# Patient Record
Sex: Female | Born: 1990 | Race: Asian | Hispanic: No | Marital: Single | Smoking: Never smoker
Health system: Southern US, Community
[De-identification: ages and names within clinical notes are randomized; demographics above are authoritative.]

## PROBLEM LIST (undated history)

## (undated) DIAGNOSIS — Z789 Other specified health status: Secondary | ICD-10-CM

---

## 2014-08-28 ENCOUNTER — Ambulatory Visit
Admission: RE | Admit: 2014-08-28 | Discharge: 2014-08-28 | Disposition: A | Discharge status: Home / Self Care | Payer: No Typology Code available for payment source | Source: Ambulatory Visit | Attending: Infectious Disease | Admitting: Infectious Disease

## 2014-08-28 ENCOUNTER — Other Ambulatory Visit: Payer: Self-pay | Admitting: Infectious Disease

## 2014-08-28 DIAGNOSIS — R7611 Nonspecific reaction to tuberculin skin test without active tuberculosis: Secondary | ICD-10-CM

## 2015-02-19 IMAGING — CR DG CHEST 1V
1 series · 1 of 1 positions shown · non-contrast
Comparison: None.

CLINICAL DATA: Positive TB blood test.  Node chest complaints.

EXAM:
CHEST - 1 VIEW

[view not recorded]
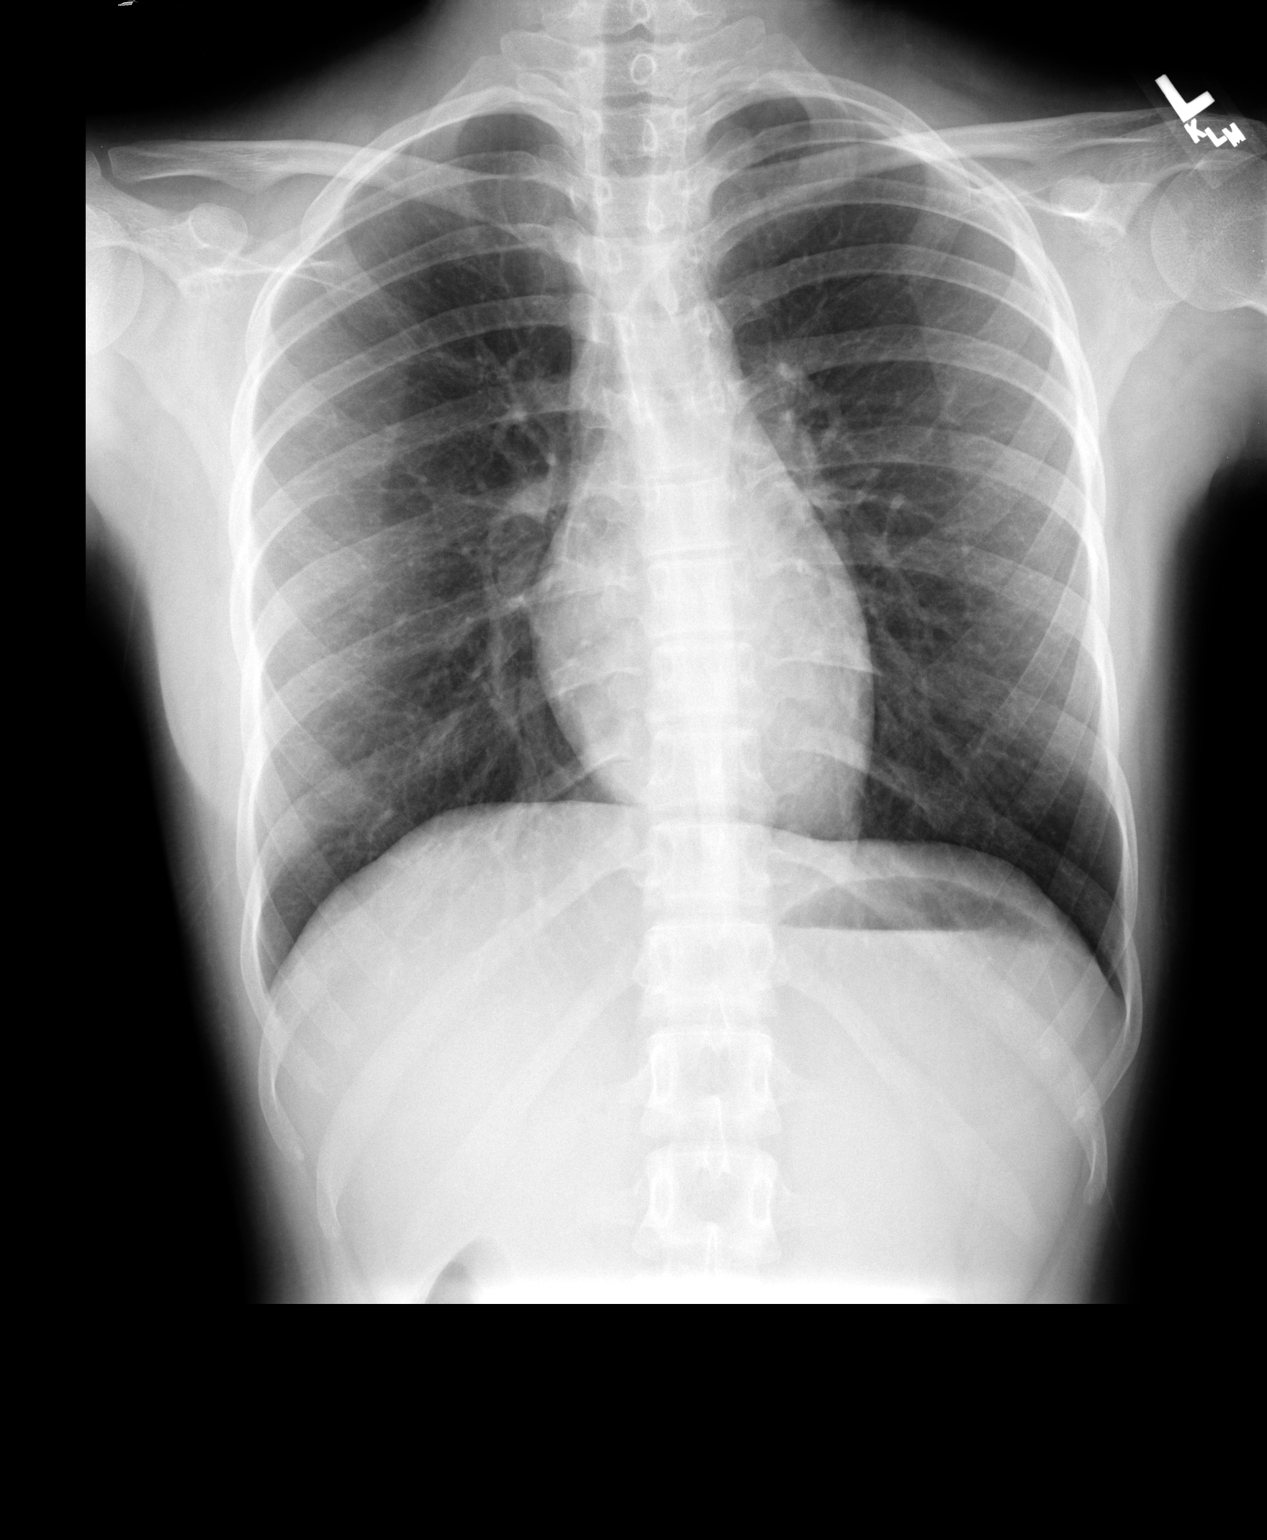

[1 of 1 positions shown; findings below may reference images not displayed]

FINDINGS: The heart size and mediastinal contours are within normal limits.
Both lungs are clear. The visualized skeletal structures are
unremarkable.
IMPRESSION: No active disease.

## 2020-02-26 ENCOUNTER — Ambulatory Visit: Payer: Self-pay

## 2020-11-19 ENCOUNTER — Ambulatory Visit: Payer: Self-pay | Attending: Internal Medicine

## 2020-11-19 DIAGNOSIS — Z23 Encounter for immunization: Secondary | ICD-10-CM

## 2020-11-19 NOTE — Progress Notes (Signed)
   Covid-19 Vaccination Clinic  Name:  Cheyenne Pena    MRN: [id number] DOB: 1991/10/10  11/19/2020  [name] was observed post Covid-19 immunization for 15 minutes without incident. She was provided with Vaccine Information Sheet and instruction to access the V-Safe system.   [name] was instructed to call 911 with any severe reactions post vaccine: Marland Kitchen Difficulty breathing  . Swelling of face and throat  . A fast heartbeat  . A bad rash all over body  . Dizziness and weakness   Immunizations Administered    Name Date Dose VIS Date Route   Pfizer COVID-19 Vaccine 11/19/2020  5:40 PM 0.3 mL 09/01/2020 Intramuscular   Manufacturer: [employer], [employer]   Lot: [id number]   NDC: [address]-5

## 2021-03-14 ENCOUNTER — Ambulatory Visit (INDEPENDENT_AMBULATORY_CARE_PROVIDER_SITE_OTHER): Payer: 59 | Admitting: Podiatry

## 2021-03-14 ENCOUNTER — Other Ambulatory Visit: Payer: Self-pay

## 2021-03-14 DIAGNOSIS — B070 Plantar wart: Secondary | ICD-10-CM | POA: Diagnosis not present

## 2021-03-14 NOTE — Progress Notes (Signed)
   Subjective: 30 y.o. female presenting today as a new patient for evaluation of pain and tenderness to the right plantar forefoot.  Patient states that she does have a history of warts to the left foot.  They are eventually healed with steady application of salicylic acid.  She presents for further treatment evaluation for the right foot   No past medical history on file.  Objective: Physical Exam General: The patient is alert and oriented x3 in no acute distress.   Dermatology: Hyperkeratotic skin lesion(s) noted to the plantar aspect of the right foot approximately 1 cm in diameter. Pinpoint bleeding noted upon debridement. Skin is warm, dry and supple bilateral lower extremities. Negative for open lesions or macerations.   Vascular: Palpable pedal pulses bilaterally. No edema or erythema noted. Capillary refill within normal limits.   Neurological: Epicritic and protective threshold grossly intact bilaterally.    Musculoskeletal Exam: Pain on palpation to the noted skin lesion(s).  Range of motion within normal limits to all pedal and ankle joints bilateral. Muscle strength 5/5 in all groups bilateral.    Assessment: #1 plantar warts right foot; superficial spreading type #2 pain in right foot     Plan of Care:  #1 Patient was evaluated. #2 Excisional debridement of the plantar wart lesion(s) was performed using a chisel blade.  Salicylic acid was applied and the lesion(s) was dressed with a dry sterile dressing. #3 salicylic acid was provided for the patient to apply daily #4 return to clinic in 4 weeks  *From [place]    Neelie Welshans M. Reymond Maynez, DPM [facility]  Dr. Felecia Shelling, DPM    [address] [place], [place] [address]                Office [phone]  Fax [phone]

## 2021-04-18 ENCOUNTER — Ambulatory Visit: Payer: 59 | Admitting: Podiatry

## 2022-11-02 ENCOUNTER — Ambulatory Visit: Payer: Self-pay | Admitting: Internal Medicine

## 2024-02-18 LAB — OB RESULTS CONSOLE RPR: RPR: NONREACTIVE

## 2024-02-18 LAB — OB RESULTS CONSOLE RUBELLA ANTIBODY, IGM: Rubella: IMMUNE

## 2024-02-18 LAB — OB RESULTS CONSOLE HIV ANTIBODY (ROUTINE TESTING): HIV: NONREACTIVE

## 2024-02-18 LAB — OB RESULTS CONSOLE HEPATITIS B SURFACE ANTIGEN: Hepatitis B Surface Ag: NEGATIVE

## 2024-02-18 LAB — HEPATITIS C ANTIBODY: HCV Ab: NEGATIVE

## 2024-02-18 LAB — OB RESULTS CONSOLE ANTIBODY SCREEN: Antibody Screen: NEGATIVE

## 2024-02-28 LAB — OB RESULTS CONSOLE GC/CHLAMYDIA
Chlamydia: NEGATIVE
Neisseria Gonorrhea: NEGATIVE

## 2024-08-28 LAB — OB RESULTS CONSOLE GBS: GBS: NEGATIVE

## 2024-09-23 ENCOUNTER — Telehealth (HOSPITAL_COMMUNITY): Payer: Self-pay | Admitting: *Deleted

## 2024-09-23 ENCOUNTER — Encounter (HOSPITAL_COMMUNITY): Payer: Self-pay | Admitting: *Deleted

## 2024-09-23 NOTE — Telephone Encounter (Signed)
 Preadmission screen

## 2024-09-24 ENCOUNTER — Telehealth (HOSPITAL_COMMUNITY): Payer: Self-pay | Admitting: *Deleted

## 2024-09-24 ENCOUNTER — Encounter (HOSPITAL_COMMUNITY): Payer: Self-pay | Admitting: *Deleted

## 2024-09-24 NOTE — Telephone Encounter (Signed)
 Preadmission screen

## 2024-09-25 ENCOUNTER — Encounter (HOSPITAL_COMMUNITY): Payer: Self-pay

## 2024-09-25 ENCOUNTER — Other Ambulatory Visit: Payer: Self-pay

## 2024-09-25 ENCOUNTER — Inpatient Hospital Stay (HOSPITAL_COMMUNITY): Admitting: Anesthesiology

## 2024-09-25 ENCOUNTER — Encounter (HOSPITAL_COMMUNITY): Payer: Self-pay | Admitting: Obstetrics and Gynecology

## 2024-09-25 ENCOUNTER — Inpatient Hospital Stay (HOSPITAL_COMMUNITY)
Admission: AD | Admit: 2024-09-25 | Discharge: 2024-09-27 | DRG: 807 | Disposition: A | Discharge status: Home / Self Care | Attending: Obstetrics and Gynecology | Admitting: Obstetrics and Gynecology

## 2024-09-25 DIAGNOSIS — Z3A40 40 weeks gestation of pregnancy: Secondary | ICD-10-CM | POA: Diagnosis not present

## 2024-09-25 DIAGNOSIS — Z833 Family history of diabetes mellitus: Secondary | ICD-10-CM | POA: Diagnosis not present

## 2024-09-25 DIAGNOSIS — Z8249 Family history of ischemic heart disease and other diseases of the circulatory system: Secondary | ICD-10-CM | POA: Diagnosis not present

## 2024-09-25 DIAGNOSIS — Z3490 Encounter for supervision of normal pregnancy, unspecified, unspecified trimester: Principal | ICD-10-CM

## 2024-09-25 DIAGNOSIS — O26893 Other specified pregnancy related conditions, third trimester: Secondary | ICD-10-CM | POA: Diagnosis present

## 2024-09-25 HISTORY — DX: Other specified health status: Z78.9

## 2024-09-25 LAB — TYPE AND SCREEN
ABO/RH(D): O POS
Antibody Screen: NEGATIVE

## 2024-09-25 LAB — CBC
HCT: 43.9 % (ref 36.0–46.0)
Hemoglobin: 15 g/dL (ref 12.0–15.0)
MCH: 29.5 pg (ref 26.0–34.0)
MCHC: 34.2 g/dL (ref 30.0–36.0)
MCV: 86.2 fL (ref 80.0–100.0)
Platelets: 253 K/uL (ref 150–400)
RBC: 5.09 MIL/uL (ref 3.87–5.11)
RDW: 13.6 % (ref 11.5–15.5)
WBC: 18.6 K/uL — ABNORMAL HIGH (ref 4.0–10.5)
nRBC: 0 % (ref 0.0–0.2)

## 2024-09-25 LAB — HIV ANTIBODY (ROUTINE TESTING W REFLEX): HIV Screen 4th Generation wRfx: NONREACTIVE

## 2024-09-25 MED ORDER — EPHEDRINE 5 MG/ML INJ: 10.0000 mg | INTRAVENOUS | Status: DC | PRN

## 2024-09-25 MED ORDER — PHENYLEPHRINE 80 MCG/ML (10ML) SYRINGE FOR IV PUSH (FOR BLOOD PRESSURE SUPPORT): 80.0000 ug | PREFILLED_SYRINGE | INTRAVENOUS | Status: DC | PRN

## 2024-09-25 MED ORDER — LIDOCAINE HCL (PF) 1 % IJ SOLN: 30.0000 mL | INTRAMUSCULAR | Status: DC | PRN

## 2024-09-25 MED ORDER — FENTANYL-BUPIVACAINE-NACL 0.5-0.125-0.9 MG/250ML-% EP SOLN
12.0000 mL/h | EPIDURAL | Status: DC | PRN
  Administered 2024-09-25: 12 mL/h via EPIDURAL
  Filled 2024-09-25: qty 250

## 2024-09-25 MED ORDER — DIPHENHYDRAMINE HCL 50 MG/ML IJ SOLN: 12.5000 mg | INTRAMUSCULAR | Status: DC | PRN

## 2024-09-25 MED ORDER — SOD CITRATE-CITRIC ACID 500-334 MG/5ML PO SOLN
30.0000 mL | ORAL | Status: DC | PRN
Start: 2024-09-25 — End: 2024-09-26

## 2024-09-25 MED ORDER — LIDOCAINE HCL (PF) 1 % IJ SOLN
INTRAMUSCULAR | Status: DC | PRN
  Administered 2024-09-25 (×2): 5 mL via EPIDURAL

## 2024-09-25 MED ORDER — ACETAMINOPHEN 325 MG PO TABS: 650.0000 mg | ORAL_TABLET | ORAL | Status: DC | PRN

## 2024-09-25 MED ORDER — FENTANYL CITRATE (PF) 100 MCG/2ML IJ SOLN: 50.0000 ug | INTRAMUSCULAR | Status: DC | PRN

## 2024-09-25 MED ORDER — OXYCODONE-ACETAMINOPHEN 5-325 MG PO TABS: 2.0000 | ORAL_TABLET | ORAL | Status: DC | PRN

## 2024-09-25 MED ORDER — OXYCODONE-ACETAMINOPHEN 5-325 MG PO TABS: 1.0000 | ORAL_TABLET | ORAL | Status: DC | PRN

## 2024-09-25 MED ORDER — LACTATED RINGERS IV SOLN: INTRAVENOUS | Status: DC

## 2024-09-25 MED ORDER — LACTATED RINGERS IV SOLN: 500.0000 mL | INTRAVENOUS | Status: DC | PRN

## 2024-09-25 MED ORDER — LACTATED RINGERS IV SOLN
500.0000 mL | Freq: Once | INTRAVENOUS | Status: AC
  Administered 2024-09-25: 500 mL via INTRAVENOUS

## 2024-09-25 MED ORDER — FLEET ENEMA RE ENEM
1.0000 | ENEMA | RECTAL | Status: DC | PRN
Start: 2024-09-25 — End: 2024-09-26

## 2024-09-25 MED ORDER — OXYTOCIN-SODIUM CHLORIDE 30-0.9 UT/500ML-% IV SOLN
2.5000 [IU]/h | INTRAVENOUS | Status: DC
  Administered 2024-09-25: 2.5 [IU]/h via INTRAVENOUS
  Filled 2024-09-25: qty 500

## 2024-09-25 MED ORDER — OXYTOCIN BOLUS FROM INFUSION
333.0000 mL | Freq: Once | INTRAVENOUS | Status: AC
  Administered 2024-09-25: 333 mL via INTRAVENOUS

## 2024-09-25 MED ORDER — ONDANSETRON HCL 4 MG/2ML IJ SOLN: 4.0000 mg | Freq: Four times a day (QID) | INTRAMUSCULAR | Status: DC | PRN

## 2024-09-25 NOTE — Anesthesia Postprocedure Evaluation (Signed)
 Anesthesia Post Note  Patient: Cheyenne Pena  Procedure(s) Performed: AN AD HOC LABOR EPIDURAL     Patient location during evaluation: Mother Baby Anesthesia Type: Epidural Level of consciousness: awake and alert Pain management: pain level controlled Vital Signs Assessment: post-procedure vital signs reviewed and stable Respiratory status: spontaneous breathing, nonlabored ventilation and respiratory function stable Cardiovascular status: stable Postop Assessment: no headache, no backache and epidural receding Anesthetic complications: no   No notable events documented.  Last Vitals:  Vitals:   09/25/24 2003 09/25/24 2356  BP: (!) 97/54   Pulse: 68   Resp: 18   Temp:  36.8 C  SpO2:      Last Pain:  Vitals:   09/25/24 2356  TempSrc: Oral  PainSc:    Pain Goal:    LLE Motor Response: Purposeful movement (09/25/24 2319) LLE Sensation: Tingling (09/25/24 2319) RLE Motor Response: Purposeful movement (09/25/24 2319) RLE Sensation: Tingling (09/25/24 2319)     Epidural/Spinal Function Cutaneous sensation: Tingles (09/25/24 2350), Patient able to flex knees: Yes (09/25/24 2319), Patient able to lift hips off bed: Yes (09/25/24 2319), Back pain beyond tenderness at insertion site: No (09/25/24 2319), Progressively worsening motor and/or sensory loss: No (09/25/24 2319), Bowel and/or bladder incontinence post epidural: No (09/25/24 2319)  Naturi Alarid

## 2024-09-25 NOTE — MAU Provider Note (Signed)
 S: Ms. Cheyenne Pena is a 33 y.o. G1P0 at [redacted]w[redacted]d  who presents to MAU today for labor evaluation.   She was evaluated, by the nurse, and has made little to no cervical change since arrival.  The nurse requests provider review strip and discharge as appropriate.   Cervical exam by RN:  Dilation: 1.5 Effacement (%): 80 Cervical Position: Middle Station: -3 Presentation: Vertex Exam by:: amber richard rnc  Follow up SCE: Dilation: 4 Effacement (%): 90 Cervical Position: Middle Station: -3 Presentation: Vertex Exam by:: amber richard rnc  Fetal Monitoring: Baseline: 125 Variability: moderate Accelerations: 15x15 Decelerations: absent Contractions: 2-5  MDM Patient status discussed with RN.  EFM Reviewed  A: 33 y.o. G1P0 [redacted]w[redacted]d  Uterine contractions   P: Admit to L&D under private provider. Care turned over to Dr. Tomblin   [name] MSN, CNM  09/25/2024 4:28 PM

## 2024-09-25 NOTE — Anesthesia Preprocedure Evaluation (Addendum)
 Anesthesia Evaluation  Patient identified by MRN, date of birth, ID band Patient awake    Reviewed: Allergy & Precautions, H&P , NPO status , Patient's Chart, lab work & pertinent test results, reviewed documented beta blocker date and time   Airway Mallampati: II  TM Distance: >3 FB Neck ROM: full    Dental no notable dental hx.    Pulmonary neg pulmonary ROS   Pulmonary exam normal breath sounds clear to auscultation       Cardiovascular negative cardio ROS Normal cardiovascular exam Rhythm:regular Rate:Normal     Neuro/Psych negative neurological ROS  negative psych ROS   GI/Hepatic negative GI ROS, Neg liver ROS,,,  Endo/Other  negative endocrine ROS    Renal/GU negative Renal ROS  negative genitourinary   Musculoskeletal negative musculoskeletal ROS (+)    Abdominal   Peds  Hematology negative hematology ROS (+)   Anesthesia Other Findings   Reproductive/Obstetrics (+) Pregnancy                              Anesthesia Physical Anesthesia Plan  ASA: 2  Anesthesia Plan: Epidural   Post-op Pain Management:    Induction:   PONV Risk Score and Plan:   Airway Management Planned:   Additional Equipment: None  Intra-op Plan:   Post-operative Plan:   Informed Consent: I have reviewed the patients History and Physical, chart, labs and discussed the procedure including the risks, benefits and alternatives for the proposed anesthesia with the patient or authorized representative who has indicated his/her understanding and acceptance.       Plan Discussed with: Anesthesiologist  Anesthesia Plan Comments:          Anesthesia Quick Evaluation

## 2024-09-25 NOTE — Progress Notes (Signed)
 Comfortable with epidural in place FHT cat one Cx 5/C/-2/vtx AROM>thin mec

## 2024-09-25 NOTE — Anesthesia Procedure Notes (Signed)
 Epidural Patient location during procedure: OB Start time: 09/25/2024 5:30 PM End time: 09/25/2024 5:39 PM  Staffing Anesthesiologist: Jerrye Sharper, MD Performed: anesthesiologist   Preanesthetic Checklist Completed: patient identified, IV checked, site marked, risks and benefits discussed, surgical consent, monitors and equipment checked, pre-op evaluation and timeout performed  Epidural Patient position: sitting Prep: DuraPrep and site prepped and draped Patient monitoring: continuous pulse ox and blood pressure Approach: midline Location: L4-L5 Injection technique: LOR air  Needle:  Needle type: Tuohy  Needle gauge: 17 G Needle length: 9 cm and 9 Needle insertion depth: 5 cm Catheter type: closed end flexible Catheter size: 19 Gauge Catheter at skin depth: 10 cm Test dose: negative and Other  Assessment Events: blood not aspirated, no cerebrospinal fluid, injection not painful, no injection resistance, no paresthesia and negative IV test  Additional Notes Patient identified. Risks and benefits discussed including failed block, incomplete  Pain control, post dural puncture headache, nerve damage, paralysis, blood pressure Changes, nausea, vomiting, reactions to medications-both toxic and allergic and post Partum back pain. All questions were answered. Patient expressed understanding and wished to proceed. Sterile technique was used throughout procedure. Epidural site was Dressed with sterile barrier dressing. No paresthesias, signs of intravascular injection Or signs of intrathecal spread were encountered.  Patient was more comfortable after the epidural was dosed. Please see RN's note for documentation of vital signs and FHR which are stable. Reason for block:procedure for pain

## 2024-09-25 NOTE — MAU Note (Signed)
 Cheyenne Pena is a 33 y.o. at [redacted]w[redacted]d here in MAU reporting: contractions since early this morning- She reports +FMs, Denies LOF, VB,  blurry vision, headaches, peripheral edema, or RUQ pain.   LMP: na Onset of complaint: early am Pain score: 5/10 Vitals:   09/25/24 1314  BP: 117/74  Pulse: 98  Resp: 16  SpO2: 100%     FHT: in room  Lab orders placed from triage: mau labor

## 2024-09-25 NOTE — Progress Notes (Signed)
 Delivery Note At 10:55 PM a viable female was delivered via Vaginal, Spontaneous (Presentation: Right Occiput Anterior).  APGAR: 9, 9; weight  .   Placenta status: Spontaneous;Pathology, Intact.  Cord: 3 vessels with the following complications: nuchal x 1.   Anesthesia: Epidural Episiotomy: ML second degree repaired Lacerations: Suture Repair: 2.0 vicryl rapide Est. Blood Loss (mL): 103  Mom to postpartum.  Baby to Couplet care / Skin to Skin.  Marlon Suleiman E Gareth Fitzner II 09/25/2024, 11:17 PM

## 2024-09-25 NOTE — H&P (Signed)
 Cheyenne Pena is a 33 y.o. female presenting for UCs. PNC uncomplicated. OB History     Gravida  1   Para      Term      Preterm      AB      Living         SAB      IAB      Ectopic      Multiple      Live Births             History reviewed. No pertinent past medical history. History reviewed. No pertinent surgical history. Family History: family history includes Cancer - Lung in her paternal grandfather; Diabetes in her maternal grandfather and paternal grandmother; Heart attack in her paternal grandmother; Hypertension in her maternal grandfather and paternal grandmother; Thyroid disease in her father. Social History:  reports that she has never smoked. She has never used smokeless tobacco. She reports that she does not currently use alcohol. She reports that she does not use drugs.     Maternal Diabetes: No Genetic Screening: Normal Maternal Ultrasounds/Referrals: Normal Fetal Ultrasounds or other Referrals:  None Maternal Substance Abuse:  No Significant Maternal Medications:  None Significant Maternal Lab Results:  Group B Strep negative Number of Prenatal Visits:greater than 3 verified prenatal visits Maternal Vaccinations: Other Comments:  None  Review of Systems  Constitutional:  Negative for fever.   Maternal Medical History:  Reason for admission: Contractions.   Fetal activity: Perceived fetal activity is normal.     Dilation: 4 Effacement (%): 90 Station: -3 Exam by:: amber richard rnc Blood pressure 110/80, pulse 81, resp. rate 16, SpO2 100%.   Fetal Exam Fetal State Assessment: Category I - tracings are normal.   Physical Exam Cardiovascular:     Rate and Rhythm: Normal rate.  Pulmonary:     Effort: Pulmonary effort is normal.     Prenatal labs: ABO, Rh:   Antibody: Negative (04/07 0000) Rubella: Immune (04/07 0000) RPR: Nonreactive (04/07 0000)  HBsAg: Negative (04/07 0000)  HIV: Non-reactive (04/07 0000)  GBS:  Negative/-- (10/16 0000)   Assessment/Plan: 33 yo G1P0 @ 40 0/7 wks Labor Admit to L&D   Lynwood FORBES Clubs II 09/25/2024, 3:57 PM

## 2024-09-26 ENCOUNTER — Encounter (HOSPITAL_COMMUNITY): Payer: Self-pay | Admitting: Obstetrics and Gynecology

## 2024-09-26 LAB — CBC
HCT: 30.8 % — ABNORMAL LOW (ref 36.0–46.0)
Hemoglobin: 10.7 g/dL — ABNORMAL LOW (ref 12.0–15.0)
MCH: 29.6 pg (ref 26.0–34.0)
MCHC: 34.7 g/dL (ref 30.0–36.0)
MCV: 85.3 fL (ref 80.0–100.0)
Platelets: 202 K/uL (ref 150–400)
RBC: 3.61 MIL/uL — ABNORMAL LOW (ref 3.87–5.11)
RDW: 13.5 % (ref 11.5–15.5)
WBC: 16.7 K/uL — ABNORMAL HIGH (ref 4.0–10.5)
nRBC: 0 % (ref 0.0–0.2)

## 2024-09-26 LAB — RPR: RPR Ser Ql: NONREACTIVE

## 2024-09-26 MED ORDER — BENZOCAINE-MENTHOL 20-0.5 % EX AERO
1.0000 | INHALATION_SPRAY | CUTANEOUS | Status: DC | PRN
  Administered 2024-09-27: 1 via TOPICAL
  Filled 2024-09-26 (×2): qty 56

## 2024-09-26 MED ORDER — COCONUT OIL OIL
1.0000 | TOPICAL_OIL | Status: DC | PRN
  Administered 2024-09-26: 1 via TOPICAL

## 2024-09-26 MED ORDER — ACETAMINOPHEN 325 MG PO TABS
650.0000 mg | ORAL_TABLET | ORAL | Status: DC | PRN
  Administered 2024-09-26: 650 mg via ORAL
  Filled 2024-09-26: qty 2

## 2024-09-26 MED ORDER — PRENATAL MULTIVITAMIN CH
1.0000 | ORAL_TABLET | Freq: Every day | ORAL | Status: DC
  Administered 2024-09-26 – 2024-09-27 (×2): 1 via ORAL
  Filled 2024-09-26 (×2): qty 1

## 2024-09-26 MED ORDER — SIMETHICONE 80 MG PO CHEW: 80.0000 mg | CHEWABLE_TABLET | ORAL | Status: DC | PRN

## 2024-09-26 MED ORDER — TETANUS-DIPHTH-ACELL PERTUSSIS 5-2-15.5 LF-MCG/0.5 IM SUSP: 0.5000 mL | Freq: Once | INTRAMUSCULAR | Status: DC

## 2024-09-26 MED ORDER — IBUPROFEN 600 MG PO TABS
600.0000 mg | ORAL_TABLET | Freq: Four times a day (QID) | ORAL | Status: DC
  Administered 2024-09-26 – 2024-09-27 (×6): 600 mg via ORAL
  Filled 2024-09-26 (×6): qty 1

## 2024-09-26 MED ORDER — ZOLPIDEM TARTRATE 5 MG PO TABS: 5.0000 mg | ORAL_TABLET | Freq: Every evening | ORAL | Status: DC | PRN

## 2024-09-26 MED ORDER — WITCH HAZEL-GLYCERIN EX PADS
1.0000 | MEDICATED_PAD | CUTANEOUS | Status: DC | PRN
Start: 2024-09-26 — End: 2024-09-27

## 2024-09-26 MED ORDER — ONDANSETRON HCL 4 MG PO TABS
4.0000 mg | ORAL_TABLET | ORAL | Status: DC | PRN
Start: 2024-09-26 — End: 2024-09-27

## 2024-09-26 MED ORDER — ONDANSETRON HCL 4 MG/2ML IJ SOLN: 4.0000 mg | INTRAMUSCULAR | Status: DC | PRN

## 2024-09-26 MED ORDER — SENNOSIDES-DOCUSATE SODIUM 8.6-50 MG PO TABS
2.0000 | ORAL_TABLET | ORAL | Status: DC
  Administered 2024-09-26 – 2024-09-27 (×2): 2 via ORAL
  Filled 2024-09-26 (×2): qty 2

## 2024-09-26 MED ORDER — DIPHENHYDRAMINE HCL 25 MG PO CAPS: 25.0000 mg | ORAL_CAPSULE | Freq: Four times a day (QID) | ORAL | Status: DC | PRN

## 2024-09-26 MED ORDER — DIBUCAINE (PERIANAL) 1 % EX OINT: 1.0000 | TOPICAL_OINTMENT | CUTANEOUS | Status: DC | PRN

## 2024-09-26 NOTE — Lactation Note (Signed)
 This note was copied from a baby's chart. Lactation Consultation Note  Patient Name: Cheyenne Pena Unijb'd Date: 09/26/2024 Age:31 hours Reason for consult: Initial assessment;Primapara;Term  P1. Mom unable to latch baby to breast. Baby cueing and hungry. LC attempted to get baby to latch but unable to obtain deep latch. #16 NS applied baby fighting NS.mom concerned because baby hadn't had any food.  Mom shown how to use DEBP & how to disassemble, clean, & reassemble parts. Pre-pump prior to feeding. Mom pumped got glistening on flange #18, it's large for mom. Informed mom on how to buy them. Mom had seen them before. Mom needs like a 13, 0r 15.  Mom stated she wanted formula. LC gave baby some formula. LC will go back for next feeding to try to get baby to latch to the NS at the breast. Newborn feeding habits, STS, I&O reviewed. Mom encouraged to feed baby 8-12 times/24 hours and with feeding cues.  Maternal Data Has patient been taught Hand Expression?: Yes Does the patient have breastfeeding experience prior to this delivery?: No  Feeding Mother's Current Feeding Choice: Breast Milk and Formula Nipple Type: Nfant Slow Flow (purple)  LATCH Score Latch: Too sleepy or reluctant, no latch achieved, no sucking elicited.  Audible Swallowing: None  Type of Nipple: Flat  Comfort (Breast/Nipple): Soft / non-tender  Hold (Positioning): Full assist, staff holds infant at breast  LATCH Score: 3   Lactation Tools Discussed/Used Tools: Shells;Pump;Flanges;Nipple Shields Nipple shield size: 16 Flange Size: 18 Breast pump type: Double-Electric Breast Pump Pump Education: Setup, frequency, and cleaning;Milk Storage Reason for Pumping: NS/flat Pumping frequency: q 3hr Pumped volume: 0 mL  Interventions Interventions: Breast feeding basics reviewed;Assisted with latch;Skin to skin;Breast massage;Hand express;Pre-pump if needed;Breast compression;Adjust position;Support  pillows;Position options;Shells;DEBP;Education;Pace feeding;LC Services brochure  Discharge Discharge Education: Outpatient recommendation Pump: DEBP (spectra )  Consult Status Consult Status: Follow-up Date: 09/26/24 Follow-up type: In-patient    Kattie Santoyo G 09/26/2024, 3:12 AM

## 2024-09-26 NOTE — Progress Notes (Signed)
 Post Partum Day 1 Subjective: no complaints, up ad lib, voiding, and tolerating PO  Objective: Blood pressure (!) 95/59, pulse 81, temperature 97.8 F (36.6 C), temperature source Oral, resp. rate 16, height 5' 4.5 (1.638 m), weight 68 kg, SpO2 100%, unknown if currently breastfeeding.  Physical Exam:  General: alert, cooperative, appears stated age, and no distress Lochia: appropriate Uterine Fundus: firm Incision:   DVT Evaluation: No evidence of DVT seen on physical exam.  Recent Labs    09/25/24 1617 09/26/24 0447  HGB 15.0 10.7*  HCT 43.9 30.8*    Assessment/Plan: Plan for discharge tomorrow and Breastfeeding Declines circumcision for their son  LOS: 1 day   Alm JAYSON Cook, MD 09/26/2024, 8:37 AM

## 2024-09-26 NOTE — Lactation Note (Signed)
 This note was copied from a baby's chart. Lactation Consultation Note  Patient Name: Cheyenne Pena Unijb'd Date: 09/26/2024 Age:31 hours Reason for consult: Follow-up assessment;Difficult latch;Primapara;1st time breastfeeding;Term, MBU nurse asked to see this mom due feeding difficulties.  Baby wide awake when LC entered the room and LC offered to assist to latch and mom receptive. Prior to [facility] entering the room per dad was trying to feed the baby from the bottle.  Per mom the Nipple Shield was started during the night, but isn't working well.  LC assessed breast tissue and noted both nipples be flat and the areola to have edema.  LC reviewed the use of the hand pump and pre - pumped and then applied the #16 NS and instill formula in the top. Baby latched for a few strong sucks and swallows from the formula in the NS . Baby was unable to sustained a latch and noted to protrude the tongue interfering. With sustaining the latch. LC suspects baby had been playing with his tongue prior to birth.  LC plan:  Breast shells while awake between feeding, except when sleeping with coconut oil to softened the areola and stretch it.  Feed the baby with cues and by 3 hours STS.  Use the #16 NS as shown and instill EBM or formula in the top as shown and latch the baby in the football position as shown, after baby feeds supplement with EBM or formula working up towards 30 ml per feeding and post pump both breast for 15 mins and save milk for the next feeding. Next feeding switch to the other breast and do the same.       Maternal Data Has patient been taught Hand Expression?: Yes Does the patient have breastfeeding experience prior to this delivery?: No  Feeding Mother's Current Feeding Choice: Breast Milk and Formula Nipple Type: Extra Slow Flow  LATCH Score Latch: Repeated attempts needed to sustain latch, nipple held in mouth throughout feeding, stimulation needed to elicit sucking  reflex.  Audible Swallowing: A few with stimulation (with formula in the Nipple Shield)  Type of Nipple: Flat (areola none compressible , Nurse to provide coconut oil)  Comfort (Breast/Nipple): Soft / non-tender  Hold (Positioning): Full assist, staff holds infant at breast  LATCH Score: 5   Lactation Tools Discussed/Used Tools: Shells;Pump;Flanges;Nipple Shields Nipple shield size: 16 Flange Size: 18;21;Other (comment) (when LC reviewed the hand pump for prepump the #18 F was not confortable for the mom and the #21 F was more comfortable. probably due to the areola edema that needs work and time to soften.) Breast pump type: Manual;Double-Electric Breast Pump Pump Education: Milk Storage;Setup, frequency, and cleaning Reason for Pumping: DL . Flay and areola edema,  Interventions Interventions: Breast feeding basics reviewed;Assisted with latch;Skin to skin;Breast massage;Hand express;Pre-pump if needed;Reverse pressure;Breast compression;Adjust position;Position options;Support pillows;Shells;Hand pump;Education;LC Services brochure;CDC milk storage guidelines;CDC Guidelines for Breast Pump Cleaning  Discharge Pump: Personal;DEBP;Manual  Consult Status Consult Status: Follow-up Date: 09/27/24 Follow-up type: In-patient    Rollene Caldron Jezlyn Westerfield 09/26/2024, 12:17 PM

## 2024-09-26 NOTE — Lactation Note (Signed)
 This note was copied from a baby's chart. Lactation Consultation Note  Patient Name: [name] Unijb'd Date: 09/26/2024 Age:31 hours Reason for consult: Follow-up assessment;Primapara;Term  P1. Mom concerned about baby not feeding taking formula. Baby very sleepy. He will wake up briefly if stimulated but will go back to sleep. He wouldn't even suck on my gloved finger. He rolls his tongue around and pushes finger and bottle nipple out of his mouth. Suggested mom let baby rest and try again in 3 hrs or with cues. Maternal Data    Feeding Mother's Current Feeding Choice: Breast Milk and Formula Nipple Type: Nfant Slow Flow (purple)  LATCH Score Latch: Too sleepy or reluctant, no latch achieved, no sucking elicited.                  Lactation Tools Discussed/Used    Interventions    Discharge    Consult Status Consult Status: Follow-up Date: 09/27/24 Follow-up type: In-patient    Mahalie Kanner G 09/26/2024, 8:25 PM

## 2024-09-26 NOTE — Lactation Note (Signed)
 This note was copied from a baby's chart. Lactation Consultation Note  Patient Name: Cheyenne Pena Date: 09/26/2024 Age:31 hours Reason for consult: L&D Initial assessment;Primapara;Term  P1 was called to L&D d/t baby cueing but unable to latch. LC placed baby in football hold and covered w/2 blankets STS. Baby has low temp. Baby will open wide at times to latch. When LC compresses breast baby will suckle on the breast.  Mom asked what can be done to get her nipples to evert. Suggested mom pre-pump before latching, wear shells would be very beneficial.  LC held breast for the baby to latch. When not compressible he is unable to latch.  Will see mom when she is transferred to [facility]. Baby taken back to warmer and RN is going to get mom ready for transfer to [facility]. Maternal Data Does the patient have breastfeeding experience prior to this delivery?: No  Feeding    LATCH Score Latch: Repeated attempts needed to sustain latch, nipple held in mouth throughout feeding, stimulation needed to elicit sucking reflex.  Audible Swallowing: None  Type of Nipple: Flat  Comfort (Breast/Nipple): Soft / non-tender  Hold (Positioning): Full assist, staff holds infant at breast  LATCH Score: 4   Lactation Tools Discussed/Used    Interventions Interventions: Assisted with latch;Skin to skin;Breast massage;Hand express;Pre-pump if needed;Breast compression;Adjust position;Support pillows;Position options  Discharge    Consult Status Consult Status: Follow-up from L&D Date: 09/26/24 Follow-up type: In-patient    Cheyenne Pena 09/26/2024, 12:37 AM

## 2024-09-27 MED ORDER — IBUPROFEN 600 MG PO TABS: 600.0000 mg | ORAL_TABLET | Freq: Four times a day (QID) | ORAL | 0 refills | Status: AC | PRN

## 2024-09-27 NOTE — Lactation Note (Addendum)
 This note was copied from a baby's chart. Lactation Consultation Note  Patient Name: Cheyenne Pena Date: 09/27/2024 Age:31 hours Reason for consult: Follow-up assessment;1st time breastfeeding;Difficult latch;Mother's request. Infant weight loss -3.90%  P1, latch difficulties, MOB applied NS and infant latched off and on , infant breastfeed for 18 minutes using the football hold position, when infant came off the breast colostrum was present in NS. LC encouraged MOB to continue to latch infant first every feeding to help establish her milk supply. MGM was supplementing infant with 20 kcal formula using lavender Nfant nipple as LC left the room. MOB will continue to work towards latching infant at the breast every feeding bu cues, on demand, 8-12 times within 24 hours, skin to skin. MOB is plans to use the DEBP when visitors leave. MOB knows to continue to use the DEBP every 3 hours for 15 minutes. MOB knows to call for further latch assistance if needed.   Maternal Data    Feeding Mother's Current Feeding Choice: Breast Milk and Formula Nipple Type: Nfant Slow Flow (purple)  LATCH Score Latch: Repeated attempts needed to sustain latch, nipple held in mouth throughout feeding, stimulation needed to elicit sucking reflex.  Audible Swallowing: A few with stimulation  Type of Nipple: Flat  Comfort (Breast/Nipple): Soft / non-tender  Hold (Positioning): Assistance needed to correctly position infant at breast and maintain latch.  LATCH Score: 6   Lactation Tools Discussed/Used    Interventions Interventions: Assisted with latch;Skin to skin;Breast compression;Adjust position;Support pillows;Position options;DEBP;Education  Discharge    Consult Status Consult Status: Follow-up Date: 09/28/24 Follow-up type: In-patient    Cheyenne Pena 09/27/2024, 7:02 PM

## 2024-09-27 NOTE — Discharge Summary (Signed)
 Postpartum Discharge Summary       Patient Name: Cheyenne Pena DOB: 17-Feb-1991 MRN: [id number]  Date of admission: 09/25/2024 Delivery date:09/25/2024 Delivering provider: TOMBLIN, JAMES Date of discharge: 09/27/2024  Admitting diagnosis: Term pregnancy [Z34.90] Intrauterine pregnancy: [redacted]w[redacted]d     Secondary diagnosis:  Principal Problem:   Term pregnancy  Additional problems:      Discharge diagnosis: Term Pregnancy Delivered                                              Post partum procedures:  Augmentation: AROM Complications: None  Hospital course: Onset of Labor With Vaginal Delivery      33 y.o. yo G1P1001 at [redacted]w[redacted]d was admitted in  on 09/25/2024. Labor course was uncomplicated Membrane Rupture Time/Date: 6:20 PM,09/25/2024  Delivery Method:Vaginal, Spontaneous Operative Delivery:N/A Episiotomy: Median Lacerations:  2nd degree;Perineal Patient had a postpartum course uncomplicated.  She is ambulating, tolerating a regular diet, passing flatus, and urinating well. Patient is discharged home in stable condition on 09/27/24.  Newborn Data: Birth date:09/25/2024 Birth time:10:55 PM Gender:Female Living status:Living Apgars:9 ,9  Weight:2778 g  Magnesium Sulfate received: No BMZ received: No Rhophylac:N/A MMR:N/A T-DaP:Given prenatally Flu: N/A RSV Vaccine received: Yes Transfusion:No Immunizations administered: Immunization History  Administered Date(s) Administered   PFIZER(Purple Top)SARS-COV-2 Vaccination 11/19/2020    Physical exam  Vitals:   09/26/24 1000 09/26/24 1440 09/26/24 2012 09/27/24 0626  BP: 98/62 112/76 91/67 106/73  Pulse: 96 93 92 62  Resp: 18 17 16 16   Temp: 97.6 F (36.4 C) 97.7 F (36.5 C) 98 F (36.7 C) (!) 97.5 F (36.4 C)  TempSrc: Oral Oral Oral Oral  SpO2:   100% 100%  Weight:      Height:       General: alert, cooperative, and no distress Lochia: appropriate Uterine Fundus: firm Incision: N/A DVT Evaluation:    Labs: Lab Results  Component Value Date   WBC 16.7 (H) 09/26/2024   HGB 10.7 (L) 09/26/2024   HCT 30.8 (L) 09/26/2024   MCV 85.3 09/26/2024   PLT 202 09/26/2024       No data to display         Edinburgh Score:    09/27/2024    7:55 AM  Edinburgh Postnatal Depression Scale Screening Tool  I have been able to laugh and see the funny side of things. 1  I have looked forward with enjoyment to things. 1  I have blamed myself unnecessarily when things went wrong. 1  I have been anxious or worried for no good reason. 2  I have felt scared or panicky for no good reason. 1  Things have been getting on top of me. 1  I have been so unhappy that I have had difficulty sleeping. 0  I have felt sad or miserable. 1  I have been so unhappy that I have been crying. 0  The thought of harming myself has occurred to me. 0  Edinburgh Postnatal Depression Scale Total 8      After visit meds:  Allergies as of 09/27/2024       Reactions   Other    Multi grain bread   Walnut         Medication List     TAKE these medications    ferrous sulfate 324 MG Tbec Take 324 mg by mouth.  ibuprofen 600 MG tablet Commonly known as: ADVIL Take 1 tablet (600 mg total) by mouth every 6 (six) hours as needed for mild pain (pain score 1-3), moderate pain (pain score 4-6) or cramping.   PRENATAL 1 PO Take by mouth.         Discharge home in stable condition Infant Feeding: Breast Infant Disposition:home Discharge instruction: per After Visit Summary and Postpartum booklet. Activity: Advance as tolerated. Pelvic rest for 6 weeks.  Diet: routine diet Anticipated Birth Control: Unsure Postpartum Appointment:6 weeks Additional Postpartum F/U:   Future Appointments:No future appointments. Follow up Visit:      09/27/2024 Alm JAYSON Cook, MD

## 2024-09-28 NOTE — Lactation Note (Signed)
 This note was copied from a baby's chart. Lactation Consultation Note  Patient Name: Cheyenne Pena Date: 09/28/2024 Age:31 hours Reason for consult: Maternal discharge;1st time breastfeeding (weight loss is stable at 3.90% within the past 24 hours.)  C/O: latch difficulties, using 16 mm NS  Per MOB, she has continue to latch infant to breast since last consult and infant is breastfeeding for 5 minutes, she continues to work on infant's latch. MOB is pumping as advised to help stimulate and establish her milk supply as infant is learning to feed better at the breast. MOB is using the 16 mm NS. [facility] sent referral for [facility] for continue latch assistance and support. LC did not assist with latch due infant recently feeding less than 2 hours prior to [facility] entering the room and MOB awaiting morning discharge. LC reviewed Breast Milk Storage and Collection and MOB knows that her EBM is safe for 4 hours whereas formula RTF once open is only 1 hour. MOB given handout Feeding Amounts MOB knows if infant is breastfeeding  and sustaining his latch 10 minutes or longer to offer 18-25 mls of supplement  EBM/Formula but if infant is not his intake should be 30 mls EBM/ Formula or greater on Day 3 of life.   Current feeding plan :Day 3 of life MOB will continue to latch infant first every feeding with 16 mm NS,  apply NS prior to latch, and continue to breastfeed infant by cues, 8-12 times within 24 hours, skin to skin. Afterwards, MOB will continue to supplement infant with any EBM first and then formula, will continue this feeding pattern until infant is sustaining his latch and breastfeeding well. MOB will continue to use her Spectra  2 DEBP every 3 hours at home due to infant's latch difficulties, using NS and to help establish her milk supply. MOB will always offer her EBM first to infant before offering formula.   Discharge education: 1-LC discussed engorgement treatment and  prevention. 2- Warning signs of dehydration in infant. 3- MOB will follow up with having appointment with Outpatient [facility] clinic.  Maternal Data    Feeding Mother's Current Feeding Choice: Breast Milk and Formula Nipple Type: Nfant Slow Flow (purple)  LATCH Score                    Lactation Tools Discussed/Used Tools: Pump;Flanges Nipple shield size: 16 Flange Size: 18 (MOB is aware need smaller flange size plans to order from [employer]. MOB does not keep smaller flange in stock.) Breast pump type: Double-Electric Breast Pump Reason for Pumping: Latch difficulties and using 16 mm NS. Pumping frequency: Pumping every 3 hours.  Interventions Interventions: CDC milk storage guidelines;CDC Guidelines for Breast Pump Cleaning;Guidelines for Milk Supply and Pumping Schedule Handout;LC Services brochure;Education;Pace feeding  Discharge Discharge Education: Outpatient Epic message sent;Outpatient recommendation Pump: Personal;DEBP (Spectra  S2)  Consult Status Consult Status: Complete Date: 09/28/24 Follow-up type: Physician    Cheyenne Pena 09/28/2024, 11:12 AM

## 2024-09-29 LAB — SURGICAL PATHOLOGY

## 2024-10-02 ENCOUNTER — Inpatient Hospital Stay (HOSPITAL_COMMUNITY)

## 2024-10-02 ENCOUNTER — Inpatient Hospital Stay (HOSPITAL_COMMUNITY): Admission: RE | Admit: 2024-10-02 | Payer: Self-pay | Source: Home / Self Care | Admitting: Obstetrics & Gynecology

## 2024-10-07 ENCOUNTER — Telehealth (HOSPITAL_COMMUNITY): Payer: Self-pay | Admitting: *Deleted

## 2024-10-07 NOTE — Telephone Encounter (Signed)
 10/07/2024  Name: Cheyenne Pena MRN: [id number] DOB: 1990/11/25  Reason for Call:  Transition of Care Hospital Discharge Call  Contact Status: Patient Contact Status: Complete  Language assistant needed:          Follow-Up Questions: Do You Have Any Concerns About Your Health As You Heal From Delivery?: No Do You Have Any Concerns About Your Infants Health?: No  Edinburgh Postnatal Depression Scale:  In the Past 7 Days: I have been able to laugh and see the funny side of things.: Definitely not so much now I have looked forward with enjoyment to things.: Definitely less than I used to I have blamed myself unnecessarily when things went wrong.: Yes, some of the time I have been anxious or worried for no good reason.: Yes, sometimes I have felt scared or panicky for no good reason.: Yes, sometimes Things have been getting on top of me.: Yes, sometimes I haven't been coping as well as usual I have been so unhappy that I have had difficulty sleeping.: Yes, sometimes I have felt sad or miserable.: Yes, quite often I have been so unhappy that I have been crying.: Only occasionally The thought of harming myself has occurred to me.: Hardly ever Edinburgh Postnatal Depression Scale Total: (!) 18 Patient denied having a plan for self harm. Stated, It's more like I feel hopeless. Patient agreed for RN to notify her provider. Also agreed for RN to email list of maternal mental health resources. Phone call placed to [facility] on-call provider. RN reported score and patient's answer to #10 to Dr. Dannielle. Dr. Dannielle is sending a message to the office to follow-up with patient tomorrow. RN also faxed patient's EPDS score to the office. PHQ2-9 Depression Scale:     Discharge Follow-up: Edinburgh score requires follow up?: Yes Provider notified of Edinburgh score?: Yes Have you already been referred for a counseling appointment?: No Patient was advised of the following resources::  Breastfeeding Support Group, Support Group Requested email information - sent by RN. Post-discharge interventions: Reviewed Newborn Safe Sleep Practices Maternal Mental Health Resources provided  Signature Allean IVAR Carton, RN, 10/07/24, [phone]
